# Patient Record
Sex: Female | Born: 1960 | Hispanic: Yes | Marital: Single | Smoking: Never smoker
Health system: Southern US, Community
[De-identification: ages and names within clinical notes are randomized; demographics above are authoritative.]

## PROBLEM LIST (undated history)

## (undated) DIAGNOSIS — E119 Type 2 diabetes mellitus without complications: Secondary | ICD-10-CM

## (undated) DIAGNOSIS — F419 Anxiety disorder, unspecified: Secondary | ICD-10-CM

## (undated) DIAGNOSIS — G4730 Sleep apnea, unspecified: Secondary | ICD-10-CM

## (undated) DIAGNOSIS — Z6841 Body Mass Index (BMI) 40.0 and over, adult: Secondary | ICD-10-CM

## (undated) DIAGNOSIS — I10 Essential (primary) hypertension: Secondary | ICD-10-CM

## (undated) DIAGNOSIS — F5104 Psychophysiologic insomnia: Secondary | ICD-10-CM

## (undated) DIAGNOSIS — N200 Calculus of kidney: Secondary | ICD-10-CM

## (undated) DIAGNOSIS — K219 Gastro-esophageal reflux disease without esophagitis: Secondary | ICD-10-CM

## (undated) DIAGNOSIS — E1142 Type 2 diabetes mellitus with diabetic polyneuropathy: Secondary | ICD-10-CM

## (undated) DIAGNOSIS — E039 Hypothyroidism, unspecified: Secondary | ICD-10-CM

## (undated) DIAGNOSIS — F32A Depression, unspecified: Secondary | ICD-10-CM

## (undated) DIAGNOSIS — J45909 Unspecified asthma, uncomplicated: Secondary | ICD-10-CM

## (undated) HISTORY — PX: ABDOMINAL HYSTERECTOMY: SHX81

---

## 2017-12-20 ENCOUNTER — Encounter: Admission: RE | Payer: Self-pay | Source: Ambulatory Visit

## 2017-12-20 ENCOUNTER — Ambulatory Visit: Admission: RE | Admit: 2017-12-20 | Payer: Medicare HMO | Source: Ambulatory Visit | Admitting: Internal Medicine

## 2017-12-20 SURGERY — ESOPHAGOGASTRODUODENOSCOPY (EGD) WITH PROPOFOL
Anesthesia: General

## 2019-09-29 ENCOUNTER — Ambulatory Visit: Payer: Medicare HMO

## 2020-04-23 ENCOUNTER — Other Ambulatory Visit: Payer: Self-pay

## 2020-04-23 ENCOUNTER — Other Ambulatory Visit
Admission: RE | Admit: 2020-04-23 | Discharge: 2020-04-23 | Disposition: A | Discharge status: Home / Self Care | Payer: Medicare HMO | Source: Ambulatory Visit | Attending: Gastroenterology | Admitting: Gastroenterology

## 2020-04-23 DIAGNOSIS — Z20822 Contact with and (suspected) exposure to covid-19: Secondary | ICD-10-CM | POA: Insufficient documentation

## 2020-04-23 DIAGNOSIS — Z01812 Encounter for preprocedural laboratory examination: Secondary | ICD-10-CM | POA: Diagnosis present

## 2020-04-24 ENCOUNTER — Encounter: Payer: Self-pay | Admitting: *Deleted

## 2020-04-24 LAB — SARS CORONAVIRUS 2 (TAT 6-24 HRS): SARS Coronavirus 2: NEGATIVE

## 2020-04-25 ENCOUNTER — Encounter
Admission: RE | Disposition: A | Discharge status: Home / Self Care | Payer: Self-pay | Source: Home / Self Care | Attending: Gastroenterology

## 2020-04-25 ENCOUNTER — Encounter: Payer: Self-pay | Admitting: *Deleted

## 2020-04-25 ENCOUNTER — Ambulatory Visit
Admission: RE | Admit: 2020-04-25 | Discharge: 2020-04-25 | Disposition: A | Discharge status: Home / Self Care | Payer: Medicare HMO | Attending: Gastroenterology | Admitting: Gastroenterology

## 2020-04-25 ENCOUNTER — Ambulatory Visit: Payer: Medicare HMO | Admitting: Certified Registered"

## 2020-04-25 DIAGNOSIS — Z1211 Encounter for screening for malignant neoplasm of colon: Secondary | ICD-10-CM | POA: Diagnosis not present

## 2020-04-25 DIAGNOSIS — F32A Depression, unspecified: Secondary | ICD-10-CM | POA: Insufficient documentation

## 2020-04-25 DIAGNOSIS — Z888 Allergy status to other drugs, medicaments and biological substances status: Secondary | ICD-10-CM | POA: Insufficient documentation

## 2020-04-25 DIAGNOSIS — K635 Polyp of colon: Secondary | ICD-10-CM | POA: Insufficient documentation

## 2020-04-25 DIAGNOSIS — Z86010 Personal history of colonic polyps: Secondary | ICD-10-CM | POA: Diagnosis not present

## 2020-04-25 DIAGNOSIS — Z90710 Acquired absence of both cervix and uterus: Secondary | ICD-10-CM | POA: Insufficient documentation

## 2020-04-25 DIAGNOSIS — Z79899 Other long term (current) drug therapy: Secondary | ICD-10-CM | POA: Diagnosis not present

## 2020-04-25 DIAGNOSIS — Z7951 Long term (current) use of inhaled steroids: Secondary | ICD-10-CM | POA: Diagnosis not present

## 2020-04-25 DIAGNOSIS — Z800 Family history of malignant neoplasm of digestive organs: Secondary | ICD-10-CM | POA: Diagnosis not present

## 2020-04-25 DIAGNOSIS — I10 Essential (primary) hypertension: Secondary | ICD-10-CM | POA: Insufficient documentation

## 2020-04-25 DIAGNOSIS — Z87442 Personal history of urinary calculi: Secondary | ICD-10-CM | POA: Insufficient documentation

## 2020-04-25 DIAGNOSIS — E1142 Type 2 diabetes mellitus with diabetic polyneuropathy: Secondary | ICD-10-CM | POA: Diagnosis not present

## 2020-04-25 DIAGNOSIS — E039 Hypothyroidism, unspecified: Secondary | ICD-10-CM | POA: Insufficient documentation

## 2020-04-25 DIAGNOSIS — K219 Gastro-esophageal reflux disease without esophagitis: Secondary | ICD-10-CM | POA: Diagnosis not present

## 2020-04-25 DIAGNOSIS — Z7984 Long term (current) use of oral hypoglycemic drugs: Secondary | ICD-10-CM | POA: Diagnosis not present

## 2020-04-25 DIAGNOSIS — K5730 Diverticulosis of large intestine without perforation or abscess without bleeding: Secondary | ICD-10-CM | POA: Diagnosis not present

## 2020-04-25 DIAGNOSIS — J4520 Mild intermittent asthma, uncomplicated: Secondary | ICD-10-CM | POA: Insufficient documentation

## 2020-04-25 DIAGNOSIS — Z791 Long term (current) use of non-steroidal anti-inflammatories (NSAID): Secondary | ICD-10-CM | POA: Diagnosis not present

## 2020-04-25 DIAGNOSIS — J45909 Unspecified asthma, uncomplicated: Secondary | ICD-10-CM | POA: Insufficient documentation

## 2020-04-25 DIAGNOSIS — K640 First degree hemorrhoids: Secondary | ICD-10-CM | POA: Insufficient documentation

## 2020-04-25 DIAGNOSIS — F419 Anxiety disorder, unspecified: Secondary | ICD-10-CM | POA: Diagnosis not present

## 2020-04-25 DIAGNOSIS — E119 Type 2 diabetes mellitus without complications: Secondary | ICD-10-CM | POA: Insufficient documentation

## 2020-04-25 DIAGNOSIS — G4730 Sleep apnea, unspecified: Secondary | ICD-10-CM | POA: Insufficient documentation

## 2020-04-25 HISTORY — DX: Unspecified asthma, uncomplicated: J45.909

## 2020-04-25 HISTORY — DX: Body Mass Index (BMI) 40.0 and over, adult: Z684

## 2020-04-25 HISTORY — DX: Sleep apnea, unspecified: G47.30

## 2020-04-25 HISTORY — DX: Anxiety disorder, unspecified: F41.9

## 2020-04-25 HISTORY — DX: Type 2 diabetes mellitus without complications: E11.9

## 2020-04-25 HISTORY — PX: COLONOSCOPY WITH PROPOFOL: SHX5780

## 2020-04-25 HISTORY — DX: Essential (primary) hypertension: I10

## 2020-04-25 HISTORY — DX: Psychophysiologic insomnia: F51.04

## 2020-04-25 HISTORY — DX: Gastro-esophageal reflux disease without esophagitis: K21.9

## 2020-04-25 HISTORY — DX: Depression, unspecified: F32.A

## 2020-04-25 HISTORY — DX: Hypothyroidism, unspecified: E03.9

## 2020-04-25 HISTORY — DX: Type 2 diabetes mellitus with diabetic polyneuropathy: E11.42

## 2020-04-25 HISTORY — DX: Calculus of kidney: N20.0

## 2020-04-25 LAB — GLUCOSE, CAPILLARY: Glucose-Capillary: 94 mg/dL (ref 70–99)

## 2020-04-25 SURGERY — COLONOSCOPY WITH PROPOFOL
Anesthesia: General

## 2020-04-25 MED ORDER — SODIUM CHLORIDE 0.9 % IV SOLN
INTRAVENOUS | Status: DC
  Administered 2020-04-25: 1000 mL via INTRAVENOUS

## 2020-04-25 MED ORDER — LIDOCAINE HCL (CARDIAC) PF 100 MG/5ML IV SOSY
PREFILLED_SYRINGE | INTRAVENOUS | Status: DC | PRN
  Administered 2020-04-25: 100 mg via INTRAVENOUS

## 2020-04-25 MED ORDER — PROPOFOL 500 MG/50ML IV EMUL
INTRAVENOUS | Status: DC | PRN
  Administered 2020-04-25: 165 ug/kg/min via INTRAVENOUS

## 2020-04-25 MED ORDER — PROPOFOL 10 MG/ML IV BOLUS
INTRAVENOUS | Status: DC | PRN
  Administered 2020-04-25: 50 mg via INTRAVENOUS

## 2020-04-25 NOTE — H&P (Signed)
Outpatient short stay form Pre-procedure 04/25/2020 9:51 AM Rachel Lot MD, MPH  Primary Physician: Dr. Greggory Stallion  Reason for visit:  Surveillance  History of present illness:   59 y/o lady with history of colon cancer in her Mother at the age of 53 and polyp that was "normal" on colonoscopy 3 years ago. Has history of hysterectomy. No blood thinners. No new symptoms.   No current facility-administered medications for this encounter.  Medications Prior to Admission  Medication Sig Dispense Refill Last Dose  . atorvastatin (LIPITOR) 40 MG tablet Take 40 mg by mouth daily.     . busPIRone (BUSPAR) 5 MG tablet Take 5 mg by mouth 3 (three) times daily.     . Cholecalciferol 125 MCG (5000 UT) capsule Take 5,000 Units by mouth daily.     . citalopram (CELEXA) 20 MG tablet Take 20 mg by mouth daily.     . diclofenac Sodium (VOLTAREN) 1 % GEL Apply topically 4 (four) times daily.     Marland Kitchen doxazosin (CARDURA) 1 MG tablet Take 1 mg by mouth daily.     . famotidine (PEPCID) 20 MG tablet Take 20 mg by mouth 2 (two) times daily.     . Fluticasone-Salmeterol (ADVAIR) 250-50 MCG/DOSE AEPB Inhale 1 puff into the lungs 2 (two) times daily.     . folic acid (FOLVITE) 400 MCG tablet Take 400 mcg by mouth daily.     Marland Kitchen gabapentin (NEURONTIN) 600 MG tablet Take 600 mg by mouth 3 (three) times daily.     . hydrALAZINE (APRESOLINE) 50 MG tablet Take 50 mg by mouth 3 (three) times daily.     . hydrochlorothiazide (HYDRODIURIL) 25 MG tablet Take 25 mg by mouth daily.     Marland Kitchen ipratropium (ATROVENT HFA) 17 MCG/ACT inhaler Inhale 2 puffs into the lungs every 6 (six) hours.     Marland Kitchen levocetirizine (XYZAL) 5 MG tablet Take 5 mg by mouth every evening.     Marland Kitchen levothyroxine (SYNTHROID) 25 MCG tablet Take 25 mcg by mouth daily before breakfast.     . losartan (COZAAR) 100 MG tablet Take 100 mg by mouth daily.     . Melatonin 5 MG CHEW Chew by mouth.     . montelukast (SINGULAIR) 10 MG tablet Take 10 mg by mouth at bedtime.      Marland Kitchen nystatin cream (MYCOSTATIN) Apply 1 application topically 2 (two) times daily.     . pantoprazole (PROTONIX) 40 MG tablet Take 40 mg by mouth daily.     . sitaGLIPtin (JANUVIA) 100 MG tablet Take 100 mg by mouth daily.     Marland Kitchen spironolactone (ALDACTONE) 25 MG tablet Take 25 mg by mouth daily.     Marland Kitchen tiZANidine (ZANAFLEX) 2 MG tablet Take by mouth every 6 (six) hours as needed for muscle spasms.        Allergies  Allergen Reactions  . Trazodone And Nefazodone      Past Medical History:  Diagnosis Date  . Anxiety   . Asthma   . BMI 40.0-44.9, adult (HCC)   . Chronic insomnia   . Depression   . Diabetes mellitus without complication (HCC)   . Diabetic peripheral neuropathy (HCC)   . GERD (gastroesophageal reflux disease)   . Hypertension   . Hypothyroidism   . Nephrolithiasis   . Sleep apnea     Review of systems:  Otherwise negative.    Physical Exam  Gen: Alert, oriented. Appears stated age.  HEENT: [place]/AT. PERRLA. Lungs: No  respiratory distress CV: RRR Abd: soft, benign, no masses. Ext: No edema.    Planned procedures: Proceed with colonoscopy. The patient understands the nature of the planned procedure, indications, risks, alternatives and potential complications including but not limited to bleeding, infection, perforation, damage to internal organs and possible oversedation/side effects from anesthesia. The patient agrees and gives consent to proceed.  Please refer to procedure notes for findings, recommendations and patient disposition/instructions.     Rachel Lot MD, MPH Gastroenterology 04/25/2020  9:51 AM

## 2020-04-25 NOTE — Transfer of Care (Signed)
Immediate Anesthesia Transfer of Care Note  Patient: Rachel Rowe  Procedure(s) Performed: COLONOSCOPY WITH PROPOFOL (N/A )  Patient Location: Endoscopy Unit  Anesthesia Type:General  Level of Consciousness: drowsy, patient cooperative and responds to stimulation  Airway & Oxygen Therapy: Patient Spontanous Breathing and Patient connected to face mask oxygen  Post-op Assessment: Report given to RN and Post -op Vital signs reviewed and stable  Post vital signs: Reviewed and stable  Last Vitals:  Vitals Value Taken Time  BP 104/59 04/25/20 1051  Temp 35.6 C 04/25/20 1051  Pulse 74 04/25/20 1055  Resp 23 04/25/20 1055  SpO2 100 % 04/25/20 1055  Vitals shown include unvalidated device data.  Last Pain:  Vitals:   04/25/20 1051  TempSrc: Temporal  PainSc: Asleep         Complications: No complications documented.

## 2020-04-25 NOTE — Interval H&P Note (Signed)
History and Physical Interval Note:  04/25/2020 9:53 AM  Rachel Rowe  has presented today for surgery, with the diagnosis of PH POLYPS.  The various methods of treatment have been discussed with the patient and family. After consideration of risks, benefits and other options for treatment, the patient has consented to  Procedure(s) with comments: COLONOSCOPY WITH PROPOFOL (N/A) - NEED SPANISH INTERPRETER as a surgical intervention.  The patient's history has been reviewed, patient examined, no change in status, stable for surgery.  I have reviewed the patient's chart and labs.  Questions were answered to the patient's satisfaction.     Regis Bill  Ok to proceed with colonoscopy.

## 2020-04-25 NOTE — Op Note (Signed)
[facility] Gastroenterology Patient Name: Rachel Rowe Procedure Date: 04/25/2020 10:26 AM MRN: [id number] Account #: [id number] Date of Birth: 04-04-1961 Admit Type: Outpatient Age: 59 Room: [facility] ENDO ROOM 3 Gender: Female Note Status: Finalized Procedure:             Colonoscopy Indications:           High risk colon cancer surveillance: Personal history                         of colonic polyps, Family history of colon cancer Providers:             Andrey Farmer MD, MD Referring MD:          No Local Md, MD (Referring MD) Medicines:             Monitored Anesthesia Care Complications:         No immediate complications. Estimated blood loss:                         Minimal. Procedure:             Pre-Anesthesia Assessment:                        - Prior to the procedure, a History and Physical was                         performed, and patient medications and allergies were                         reviewed. The patient is competent. The risks and                         benefits of the procedure and the sedation options and                         risks were discussed with the patient. All questions                         were answered and informed consent was obtained.                         Patient identification and proposed procedure were                         verified by the physician, the nurse, the anesthetist                         and the technician in the endoscopy suite. Mental                         Status Examination: alert and oriented. Airway                         Examination: normal oropharyngeal airway and neck                         mobility. Respiratory Examination: clear to  auscultation. CV Examination: normal. Prophylactic                         Antibiotics: The patient does not require prophylactic                         antibiotics. Prior Anticoagulants: The patient has                          taken no previous anticoagulant or antiplatelet                         agents. ASA Grade Assessment: II - A patient with mild                         systemic disease. After reviewing the risks and                         benefits, the patient was deemed in satisfactory                         condition to undergo the procedure. The anesthesia                         plan was to use monitored anesthesia care (MAC).                         Immediately prior to administration of medications,                         the patient was re-assessed for adequacy to receive                         sedatives. The heart rate, respiratory rate, oxygen                         saturations, blood pressure, adequacy of pulmonary                         ventilation, and response to care were monitored                         throughout the procedure. The physical status of the                         patient was re-assessed after the procedure.                        After obtaining informed consent, the colonoscope was                         passed under direct vision. Throughout the procedure,                         the patient's blood pressure, pulse, and oxygen                         saturations were monitored continuously. The  Colonoscope was introduced through the anus and                         advanced to the the cecum, identified by appendiceal                         orifice and ileocecal valve. The colonoscopy was                         performed without difficulty. The patient tolerated                         the procedure well. The quality of the bowel                         preparation was good. Findings:      The perianal and digital rectal examinations were normal.      A single small-mouthed diverticulum was found in the descending colon.      A 2 mm polyp was found in the sigmoid colon. The polyp was hyperplastic.       The polyp was removed with a jumbo  cold forceps. Resection and retrieval       were complete. Estimated blood loss was minimal.      A localized area of mildly erythematous mucosa was found in the       descending colon. Biopsies were taken with a cold forceps for histology.       Estimated blood loss was minimal.      Non-bleeding internal hemorrhoids were found during retroflexion. The       hemorrhoids were Grade I (internal hemorrhoids that do not prolapse).      The exam was otherwise without abnormality on direct and retroflexion       views. Impression:            - Diverticulosis in the descending colon.                        - One 2 mm polyp in the sigmoid colon, removed with a                         jumbo cold forceps. Resected and retrieved.                        - Erythematous mucosa in the descending colon.                         Biopsied.                        - Non-bleeding internal hemorrhoids.                        - The examination was otherwise normal on direct and                         retroflexion views. Recommendation:        - Discharge patient to home.                        - Resume previous diet.                        -  Continue present medications.                        - Await pathology results.                        - Repeat colonoscopy in 5 years for surveillance.                        - Return to referring physician as previously                         scheduled. Procedure Code(s):     --- Professional ---                        [phone], Colonoscopy, flexible; with biopsy, single or                         multiple Diagnosis Code(s):     --- Professional ---                        Z86.010, Personal history of colonic polyps                        K64.0, First degree hemorrhoids                        K63.5, Polyp of colon                        K63.89, Other specified diseases of intestine                        Z80.0, Family history of malignant neoplasm of                          digestive organs                        K57.30, Diverticulosis of large intestine without                         perforation or abscess without bleeding CPT copyright 2019 American Medical Association. All rights reserved. The codes documented in this report are preliminary and upon coder review may  be revised to meet current compliance requirements. Andrey Farmer, MD Andrey Farmer MD, MD 04/25/2020 10:51:07 AM Number of Addenda: 0 Note Initiated On: 04/25/2020 10:26 AM Scope Withdrawal Time: 0 hours 9 minutes 55 seconds  Total Procedure Duration: 0 hours 15 minutes 32 seconds  Estimated Blood Loss:  Estimated blood loss was minimal.      [facility]

## 2020-04-25 NOTE — Anesthesia Preprocedure Evaluation (Signed)
Anesthesia Evaluation  Patient identified by MRN, date of birth, ID band Patient awake    Reviewed: Allergy & Precautions, NPO status , Patient's Chart, lab work & pertinent test results  History of Anesthesia Complications Negative for: history of anesthetic complications  Airway Mallampati: II  TM Distance: >3 FB Neck ROM: Full    Dental no notable dental hx.    Pulmonary asthma (mild intermittent) , sleep apnea ,    breath sounds clear to auscultation- rhonchi (-) wheezing      Cardiovascular hypertension, Pt. on medications (-) CAD, (-) Past MI, (-) Cardiac Stents and (-) CABG  Rhythm:Regular Rate:Normal - Systolic murmurs and - Diastolic murmurs    Neuro/Psych neg Seizures PSYCHIATRIC DISORDERS Anxiety Depression negative neurological ROS     GI/Hepatic Neg liver ROS, GERD  ,  Endo/Other  diabetes, Oral Hypoglycemic AgentsHypothyroidism   Renal/GU Renal disease: hx of nephrolithiasis.     Musculoskeletal negative musculoskeletal ROS (+)   Abdominal (+) + obese,   Peds  Hematology negative hematology ROS (+)   Anesthesia Other Findings Past Medical History: No date: Anxiety No date: Asthma No date: BMI 40.0-44.9, adult (HCC) No date: Chronic insomnia No date: Depression No date: Diabetes mellitus without complication (HCC) No date: Diabetic peripheral neuropathy (HCC) No date: GERD (gastroesophageal reflux disease) No date: Hypertension No date: Hypothyroidism No date: Nephrolithiasis No date: Sleep apnea   Reproductive/Obstetrics                             Anesthesia Physical Anesthesia Plan  ASA: III  Anesthesia Plan: General   Post-op Pain Management:    Induction: Intravenous  PONV Risk Score and Plan: 2 and Propofol infusion  Airway Management Planned: Natural Airway  Additional Equipment:   Intra-op Plan:   Post-operative Plan:   Informed Consent: I  have reviewed the patients History and Physical, chart, labs and discussed the procedure including the risks, benefits and alternatives for the proposed anesthesia with the patient or authorized representative who has indicated his/her understanding and acceptance.     Dental advisory given  Plan Discussed with: CRNA and Anesthesiologist  Anesthesia Plan Comments:         Anesthesia Quick Evaluation

## 2020-04-25 NOTE — Anesthesia Postprocedure Evaluation (Signed)
Anesthesia Post Note  Patient: Rachel Rowe  Procedure(s) Performed: COLONOSCOPY WITH PROPOFOL (N/A )  Patient location during evaluation: Endoscopy Anesthesia Type: General Level of consciousness: awake and alert and oriented Pain management: pain level controlled Vital Signs Assessment: post-procedure vital signs reviewed and stable Respiratory status: spontaneous breathing, nonlabored ventilation and respiratory function stable Cardiovascular status: blood pressure returned to baseline and stable Postop Assessment: no signs of nausea or vomiting Anesthetic complications: no   No complications documented.   Last Vitals:  Vitals:   04/25/20 1120 04/25/20 1130  BP: (!) 129/92 127/77  Pulse: 68 69  Resp: 13 17  Temp:    SpO2: 98% 99%    Last Pain:  Vitals:   04/25/20 1051  TempSrc: Temporal  PainSc: Asleep                 Nazim Kadlec

## 2020-04-25 NOTE — Anesthesia Procedure Notes (Signed)
Procedure Name: General with mask airway Performed by: Fletcher-Harrison, Senya Hinzman, CRNA Pre-anesthesia Checklist: Patient identified, Emergency Drugs available, Suction available and Patient being monitored Patient Re-evaluated:Patient Re-evaluated prior to induction Oxygen Delivery Method: Simple face mask Induction Type: IV induction Placement Confirmation: positive ETCO2 and CO2 detector Dental Injury: Teeth and Oropharynx as per pre-operative assessment        

## 2020-04-28 LAB — SURGICAL PATHOLOGY

## 2020-04-30 ENCOUNTER — Other Ambulatory Visit: Payer: Self-pay | Admitting: Family Medicine

## 2020-04-30 ENCOUNTER — Ambulatory Visit
Admission: RE | Admit: 2020-04-30 | Discharge: 2020-04-30 | Disposition: A | Discharge status: Home / Self Care | Payer: Medicare HMO | Source: Ambulatory Visit | Attending: Family Medicine | Admitting: Family Medicine

## 2020-04-30 ENCOUNTER — Other Ambulatory Visit: Payer: Self-pay

## 2020-04-30 DIAGNOSIS — R1032 Left lower quadrant pain: Secondary | ICD-10-CM | POA: Insufficient documentation

## 2020-04-30 LAB — POCT I-STAT CREATININE: Creatinine, Ser: 1.1 mg/dL — ABNORMAL HIGH (ref 0.44–1.00)

## 2020-04-30 MED ORDER — IOHEXOL 300 MG/ML  SOLN
100.0000 mL | Freq: Once | INTRAMUSCULAR | Status: AC | PRN
  Administered 2020-04-30: 100 mL via INTRAVENOUS

## 2021-03-19 ENCOUNTER — Other Ambulatory Visit: Payer: Self-pay | Admitting: Family Medicine

## 2021-03-19 DIAGNOSIS — Z1231 Encounter for screening mammogram for malignant neoplasm of breast: Secondary | ICD-10-CM

## 2021-04-20 ENCOUNTER — Other Ambulatory Visit: Payer: Self-pay

## 2021-04-20 ENCOUNTER — Ambulatory Visit
Admission: RE | Admit: 2021-04-20 | Discharge: 2021-04-20 | Disposition: A | Discharge status: Home / Self Care | Payer: Medicare HMO | Source: Ambulatory Visit | Attending: Family Medicine | Admitting: Family Medicine

## 2021-04-20 DIAGNOSIS — Z1231 Encounter for screening mammogram for malignant neoplasm of breast: Secondary | ICD-10-CM | POA: Diagnosis not present

## 2021-04-21 ENCOUNTER — Inpatient Hospital Stay
Admission: RE | Admit: 2021-04-21 | Discharge: 2021-04-21 | Disposition: A | Discharge status: Home / Self Care | Payer: Self-pay | Source: Ambulatory Visit | Attending: *Deleted | Admitting: *Deleted

## 2021-04-21 ENCOUNTER — Other Ambulatory Visit: Payer: Self-pay | Admitting: *Deleted

## 2021-04-21 DIAGNOSIS — Z1231 Encounter for screening mammogram for malignant neoplasm of breast: Secondary | ICD-10-CM

## 2021-08-17 ENCOUNTER — Other Ambulatory Visit (HOSPITAL_COMMUNITY): Payer: Self-pay | Admitting: Family Medicine

## 2021-08-17 ENCOUNTER — Other Ambulatory Visit: Payer: Self-pay | Admitting: Family Medicine

## 2021-08-17 DIAGNOSIS — R109 Unspecified abdominal pain: Secondary | ICD-10-CM

## 2021-08-18 ENCOUNTER — Other Ambulatory Visit: Payer: Self-pay

## 2021-08-18 ENCOUNTER — Ambulatory Visit
Admission: RE | Admit: 2021-08-18 | Discharge: 2021-08-18 | Disposition: A | Discharge status: Home / Self Care | Payer: Medicare HMO | Source: Ambulatory Visit | Attending: Family Medicine | Admitting: Family Medicine

## 2021-08-18 DIAGNOSIS — R109 Unspecified abdominal pain: Secondary | ICD-10-CM | POA: Insufficient documentation

## 2021-08-20 ENCOUNTER — Other Ambulatory Visit: Payer: Self-pay

## 2021-08-20 ENCOUNTER — Emergency Department
Admission: EM | Admit: 2021-08-20 | Discharge: 2021-08-20 | Disposition: A | Discharge status: Home / Self Care | Payer: Medicare HMO | Attending: Emergency Medicine | Admitting: Emergency Medicine

## 2021-08-20 ENCOUNTER — Encounter: Payer: Self-pay | Admitting: Emergency Medicine

## 2021-08-20 DIAGNOSIS — N200 Calculus of kidney: Secondary | ICD-10-CM | POA: Insufficient documentation

## 2021-08-20 DIAGNOSIS — J45909 Unspecified asthma, uncomplicated: Secondary | ICD-10-CM | POA: Diagnosis not present

## 2021-08-20 DIAGNOSIS — R109 Unspecified abdominal pain: Secondary | ICD-10-CM | POA: Diagnosis present

## 2021-08-20 DIAGNOSIS — E119 Type 2 diabetes mellitus without complications: Secondary | ICD-10-CM | POA: Insufficient documentation

## 2021-08-20 DIAGNOSIS — E039 Hypothyroidism, unspecified: Secondary | ICD-10-CM | POA: Diagnosis not present

## 2021-08-20 DIAGNOSIS — I10 Essential (primary) hypertension: Secondary | ICD-10-CM | POA: Diagnosis not present

## 2021-08-20 LAB — BASIC METABOLIC PANEL
Anion gap: 8 (ref 5–15)
BUN: 12 mg/dL (ref 8–23)
CO2: 29 mmol/L (ref 22–32)
Calcium: 9.3 mg/dL (ref 8.9–10.3)
Chloride: 103 mmol/L (ref 98–111)
Creatinine, Ser: 0.66 mg/dL (ref 0.44–1.00)
GFR, Estimated: >60 mL/min (ref >60)
Glucose, Bld: 90 mg/dL (ref 70–99)
Potassium: 4.5 mmol/L (ref 3.5–5.1)
Sodium: 140 mmol/L (ref 135–145)

## 2021-08-20 LAB — URINALYSIS, ROUTINE W REFLEX MICROSCOPIC
Bacteria, UA: NONE SEEN
Bilirubin Urine: NEGATIVE
Glucose, UA: NEGATIVE mg/dL
Ketones, ur: NEGATIVE mg/dL
Leukocytes,Ua: NEGATIVE
Nitrite: NEGATIVE
Protein, ur: NEGATIVE mg/dL
Specific Gravity, Urine: 1.008 (ref 1.005–1.030)
Squamous Epithelial / HPF: NONE SEEN (ref 0–5)
WBC, UA: NONE SEEN WBC/hpf (ref 0–5)
pH: 8 (ref 5.0–8.0)

## 2021-08-20 LAB — CBC
HCT: 37.5 % (ref 36.0–46.0)
Hemoglobin: 11.7 g/dL — ABNORMAL LOW (ref 12.0–15.0)
MCH: 29.7 pg (ref 26.0–34.0)
MCHC: 31.2 g/dL (ref 30.0–36.0)
MCV: 95.2 fL (ref 80.0–100.0)
Platelets: 306 10*3/uL (ref 150–400)
RBC: 3.94 MIL/uL (ref 3.87–5.11)
RDW: 13.2 % (ref 11.5–15.5)
WBC: 9.8 10*3/uL (ref 4.0–10.5)
nRBC: 0 % (ref 0.0–0.2)

## 2021-08-20 MED ORDER — KETOROLAC TROMETHAMINE 30 MG/ML IJ SOLN
15.0000 mg | Freq: Once | INTRAMUSCULAR | Status: AC
  Administered 2021-08-20: 15 mg via INTRAVENOUS
  Filled 2021-08-20: qty 1

## 2021-08-20 MED ORDER — TAMSULOSIN HCL 0.4 MG PO CAPS: 0.4000 mg | ORAL_CAPSULE | Freq: Every day | ORAL | 0 refills | Status: AC

## 2021-08-20 MED ORDER — SODIUM CHLORIDE 0.9 % IV BOLUS
1000.0000 mL | Freq: Once | INTRAVENOUS | Status: AC
  Administered 2021-08-20: 1000 mL via INTRAVENOUS

## 2021-08-20 MED ORDER — HYDROCODONE-ACETAMINOPHEN 5-325 MG PO TABS: 1.0000 | ORAL_TABLET | Freq: Four times a day (QID) | ORAL | 0 refills | Status: AC | PRN

## 2021-08-20 MED ORDER — ONDANSETRON 4 MG PO TBDP: 4.0000 mg | ORAL_TABLET | Freq: Three times a day (TID) | ORAL | 0 refills | Status: AC | PRN

## 2021-08-20 NOTE — ED Triage Notes (Signed)
Pt comes into the ED via POV c/o right flank pain that started last week and has progressively gotten worse.  Pt has a h/o kidney stones.  Pt saw MD last week and they ordered outpatient CT, but she has not gotten the results.  Pt c/o severe pain with urination as well as burning.  Pt ambulatory to triage at this time with even and unlabored respirations.

## 2021-08-20 NOTE — Discharge Instructions (Addendum)
-  Take tamsulosin daily as prescribed. Take ondansetron as needed for nausea. -Manage pain at home with ibuprofen as needed.  Only take hydrocodone/acetaminophen only if pain worsens

## 2021-08-20 NOTE — ED Provider Notes (Signed)
[facility] Provider Note    Event Date/Time   First MD Initiated Contact with Patient 08/20/21 1522     (approximate)   History   Chief Complaint Flank Pain   HPI Rachel Rowe is a 61 y.o. female, history of hypertension, asthma, hypothyroidism, diabetes, anxiety, nephrolithiasis, presents to the emergency department for evaluation of flank pain.  Patient states that the flank pain started last week and has progressively gotten worse.  Predominantly right-sided pain.  Reports burning sensation when she urinates.  She states that she received a, outpatient CT scan by her primary care provider for suspected kidney stones, but she was not informed of the results or provided with any treatment.  Denies chest pain, shortness of breath, abdominal pain, fever/chills, rashes/lesions, headache, or nausea/vomiting.  Per external records review, patient was recently evaluated on 08/14/2021 at [facility] primary care where she presented with right-sided flank pain.  An outpatient renal stone CT was ordered.  She was provided with meloxicam for pain management  History Limitations: Spanish-speaking.      Physical Exam  Triage Vital Signs: ED Triage Vitals  Enc Vitals Group     BP 08/20/21 1354 (!) 145/72     Pulse Rate 08/20/21 1354 71     Resp 08/20/21 1354 17     Temp 08/20/21 1354 98.3 F (36.8 C)     Temp Source 08/20/21 1354 Oral     SpO2 08/20/21 1354 98 %     Weight 08/20/21 1352 223 lb 8.7 oz (101.4 kg)     Height 08/20/21 1352 5\' 4"  (1.626 m)     Head Circumference --      Peak Flow --      Pain Score 08/20/21 1352 10     Pain Loc --      Pain Edu? --      Excl. in GC? --     Most recent vital signs: Vitals:   08/20/21 1354  BP: (!) 145/72  Pulse: 71  Resp: 17  Temp: 98.3 F (36.8 C)  SpO2: 98%    General: Awake, NAD.  CV: Good peripheral perfusion.  Resp: Normal effort.  Abd: Soft, non-tender. No distention.  Neuro: At baseline.  No gross neurological deficits. Other: CVA tenderness present on the right side.  Physical Exam    ED Results / Procedures / Treatments  Labs (all labs ordered are listed, but only abnormal results are displayed) Labs Reviewed  URINALYSIS, ROUTINE W REFLEX MICROSCOPIC - Abnormal; Notable for the following components:      Result Value   Color, Urine STRAW (*)    APPearance CLEAR (*)    Hgb urine dipstick SMALL (*)    All other components within normal limits  CBC - Abnormal; Notable for the following components:   Hemoglobin 11.7 (*)    All other components within normal limits  BASIC METABOLIC PANEL     EKG Not applicable.   RADIOLOGY  ED Provider Interpretation: Not applicable.  No results found.  PROCEDURES:  Critical Care performed: None applicable.  Procedures    MEDICATIONS ORDERED IN ED: Medications  sodium chloride 0.9 % bolus 1,000 mL (0 mLs Intravenous Stopped 08/20/21 1657)  ketorolac (TORADOL) 30 MG/ML injection 15 mg (15 mg Intravenous Given 08/20/21 1706)     IMPRESSION / MDM / ASSESSMENT AND PLAN / ED COURSE  I reviewed the triage vital signs and the nursing notes.  Rachel Rowe is a 61 y.o. female, history of hypertension, asthma, hypothyroidism, diabetes, anxiety, nephrolithiasis, presents to the emergency department for evaluation of flank pain.  Patient states that the flank pain started last week and has progressively gotten worse.   Differential diagnosis includes, but is not limited to, nephrolithiasis, cystitis, pyelonephritis.   ED Course Patient appears stable, but uncomfortable.  Vital signs are within normal limits.  She is afebrile.  We will go ahead and treat with ketorolac IV and IV fluids.  CBC shows no leukocytosis or clinically significant anemia.  BMP unremarkable for electrolyte abnormalities or acute kidney injury.   Urinalysis significant for small amounts of hemoglobin in the urine,  otherwise no evidence of infection.  Consistent with nephrolithiasis  CT renal stone study from 08/18/2021 shows bilateral nonobstructive punctate nephrolithiasis.  No hydronephrosis.   Assessment/Plan History, physical exam, and work-up thus far consistent with nephrolithiasis.  Patient has not experienced an acute worsening since her CT scan 2 days prior.  She is afebrile.  Urinalysis unremarkable for evidence of infection.  Vital signs and lab work-up reassuring for no serious pathology.  Unlikely pyelonephritis, AAA, or other acute abdominal pathology. Patient states that her primary reason for being here was to receive the results of her CT scan and to receive outpatient medications.  We will plan to discharge this patient with a prescription for tamsulosin, ondansetron, and hydrocodone/acetaminophen to be used as needed for pain.  Encouraged patient to follow-up with her primary care provider as needed.  Patient was provided with anticipatory guidance, return precautions, and educational material. Encouraged the patient to return to the emergency department at any time if they begin to experience any new or worsening symptoms.     FINAL CLINICAL IMPRESSION(S) / ED DIAGNOSES   Final diagnoses:  Kidney stone     Rx / DC Orders   ED Discharge Orders          Ordered    tamsulosin (FLOMAX) 0.4 MG CAPS capsule  Daily        08/20/21 1708    HYDROcodone-acetaminophen (NORCO/VICODIN) 5-325 MG tablet  Every 6 hours PRN        08/20/21 1708    ondansetron (ZOFRAN-ODT) 4 MG disintegrating tablet  Every 8 hours PRN        08/20/21 1714             Note:  This document was prepared using Dragon voice recognition software and may include unintentional dictation errors.   Varney Daily, [place] 08/21/21 0041    Gilles Chiquito, MD 08/21/21 [phone]

## 2022-03-12 ENCOUNTER — Other Ambulatory Visit: Payer: Self-pay | Admitting: Family Medicine

## 2022-03-12 DIAGNOSIS — R1012 Left upper quadrant pain: Secondary | ICD-10-CM

## 2022-03-16 ENCOUNTER — Ambulatory Visit
Admission: RE | Admit: 2022-03-16 | Discharge: 2022-03-16 | Disposition: A | Discharge status: Home / Self Care | Payer: Medicare HMO | Source: Ambulatory Visit | Attending: Family Medicine | Admitting: Family Medicine

## 2022-03-16 DIAGNOSIS — R1012 Left upper quadrant pain: Secondary | ICD-10-CM | POA: Insufficient documentation

## 2023-04-15 IMAGING — MG MM DIGITAL SCREENING BILAT W/ TOMO AND CAD
6 of 10 series · 6 of 30 positions shown · non-contrast
Comparison: Previous exam(s).

CLINICAL DATA: Screening.

EXAM:
DIGITAL SCREENING BILATERAL MAMMOGRAM WITH TOMOSYNTHESIS AND CAD
TECHNIQUE: Bilateral screening digital craniocaudal and mediolateral oblique
mammograms were obtained. Bilateral screening digital breast
tomosynthesis was performed. The images were evaluated with
computer-aided detection.

[R CC synth-2D]
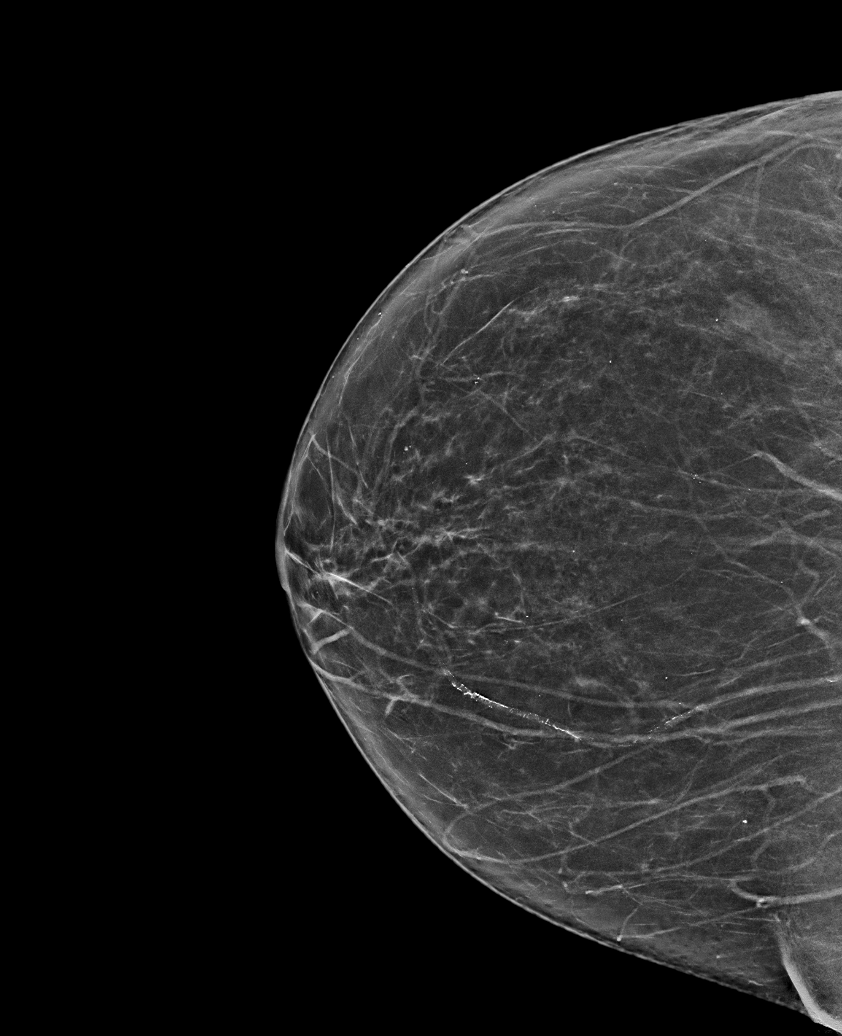

[L CC synth-2D]
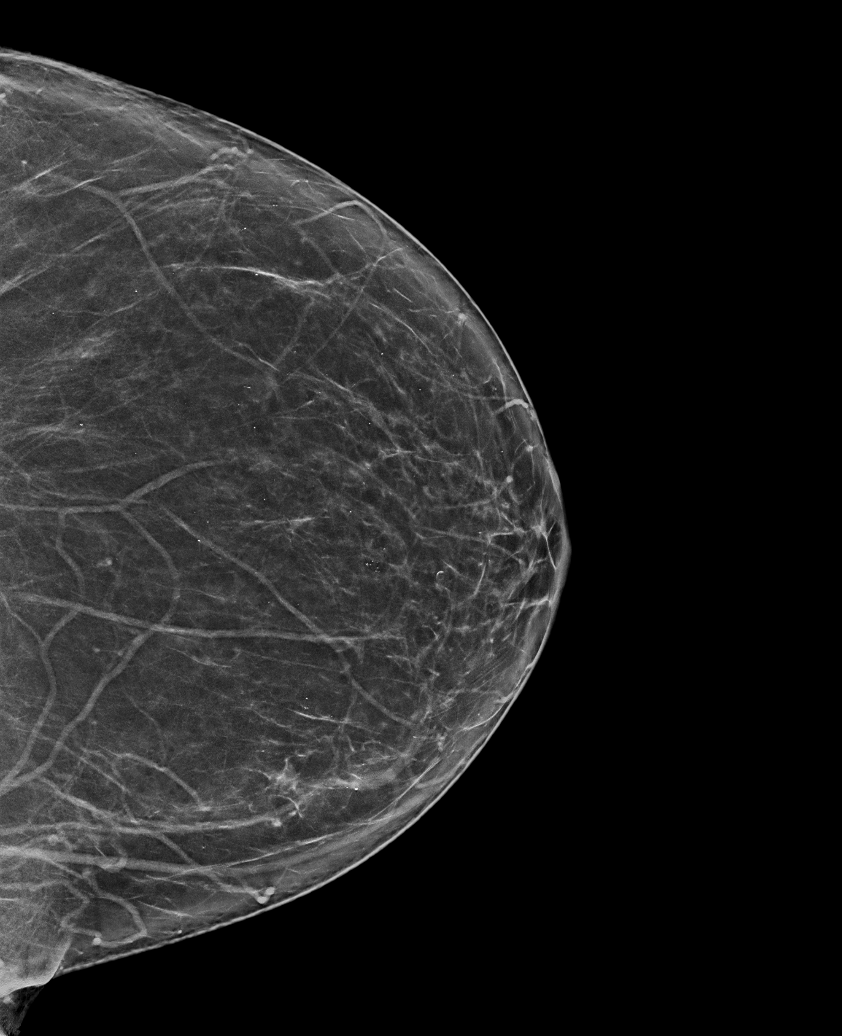

[R MLO synth-2D (1 of 2)]
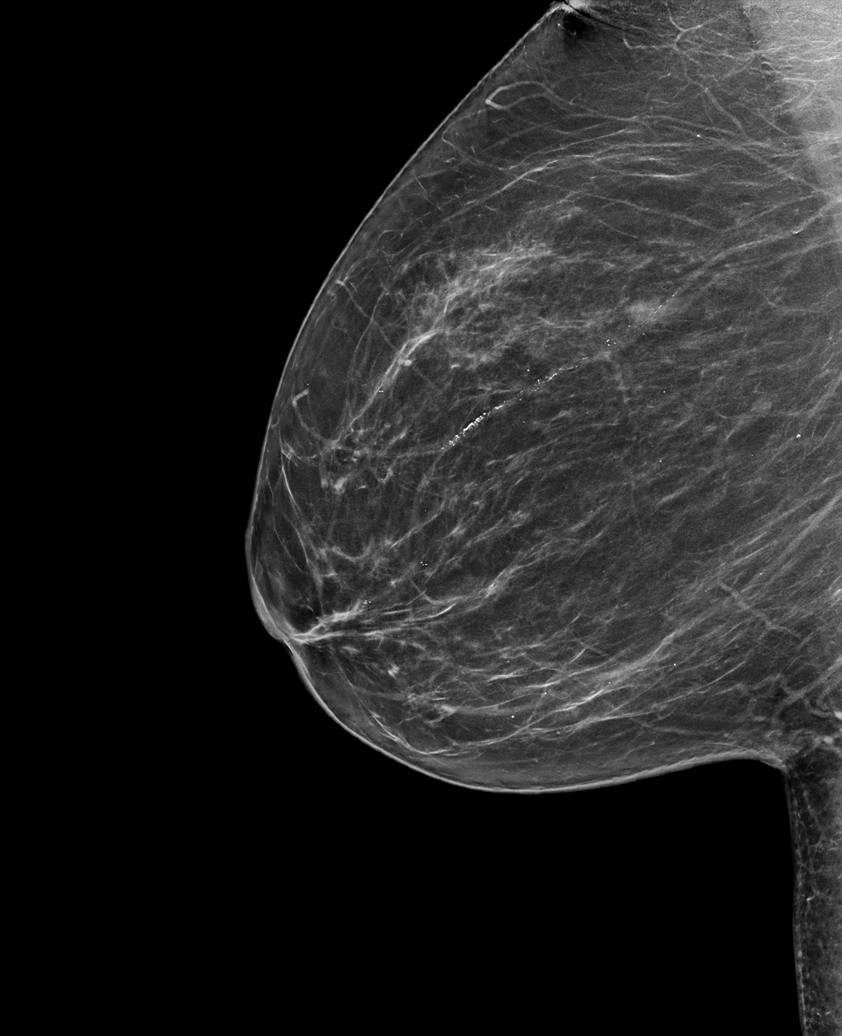

[R MLO synth-2D (2 of 2)]
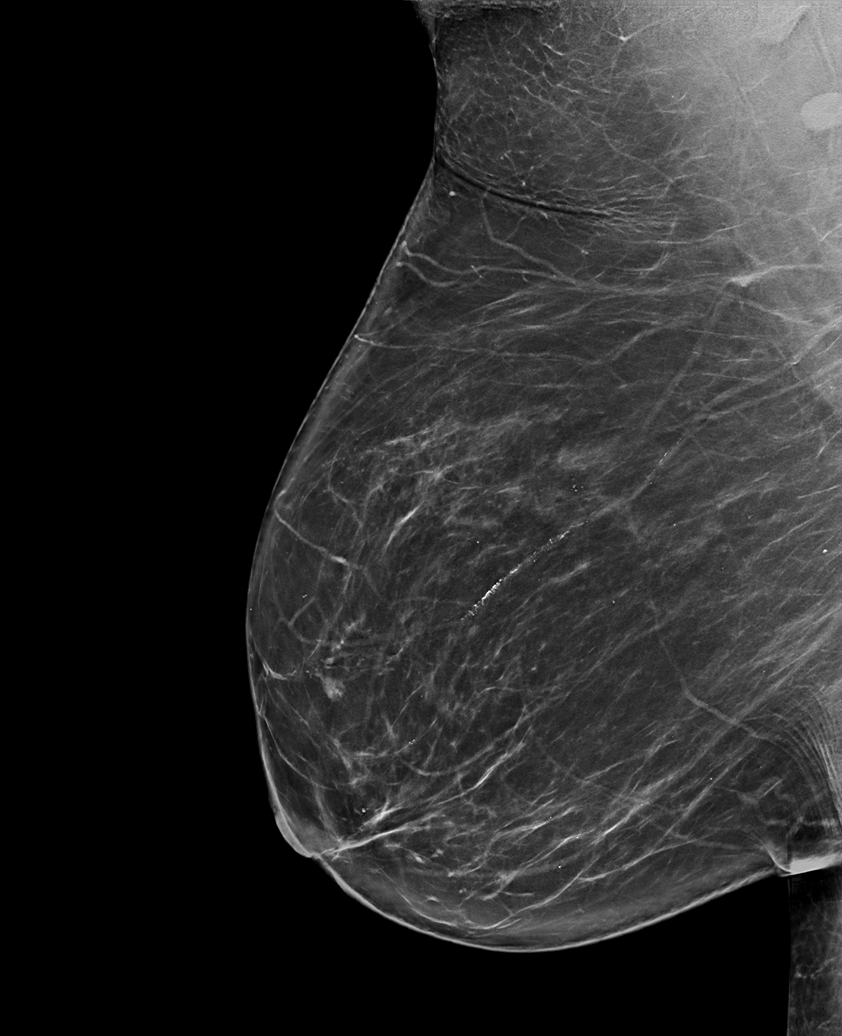

[L MLO synth-2D]
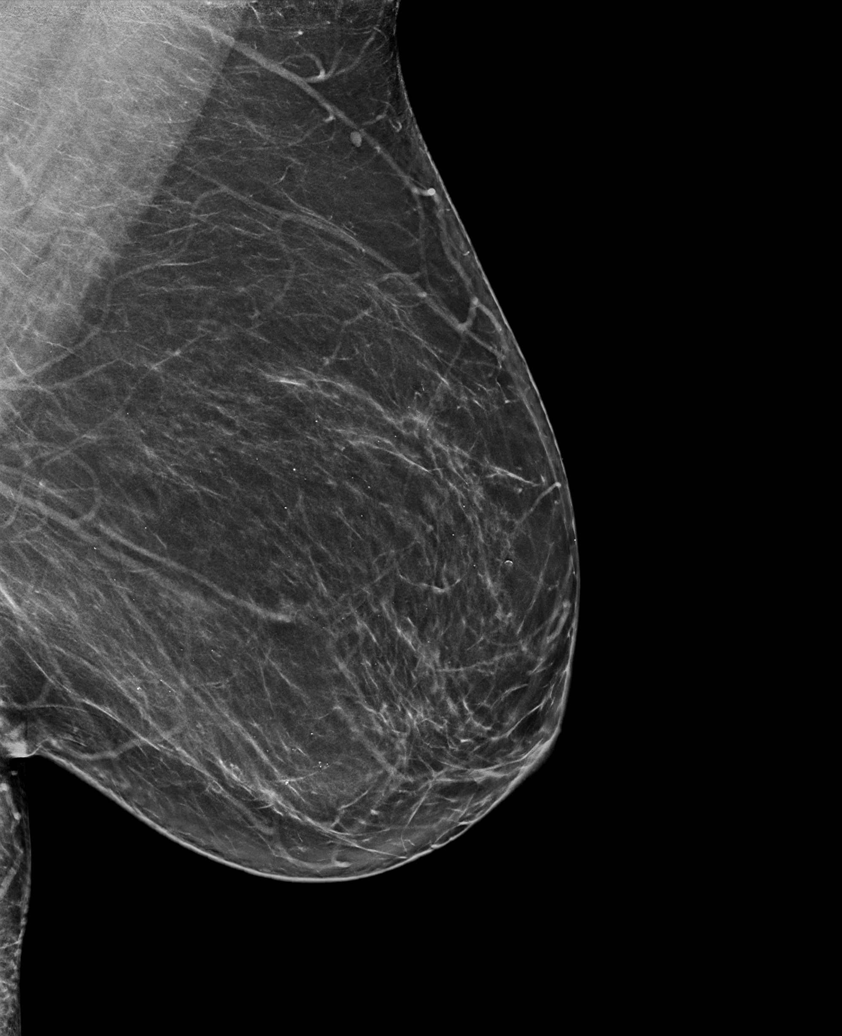

[R MLO tomo · tomo slice 46/91.0]
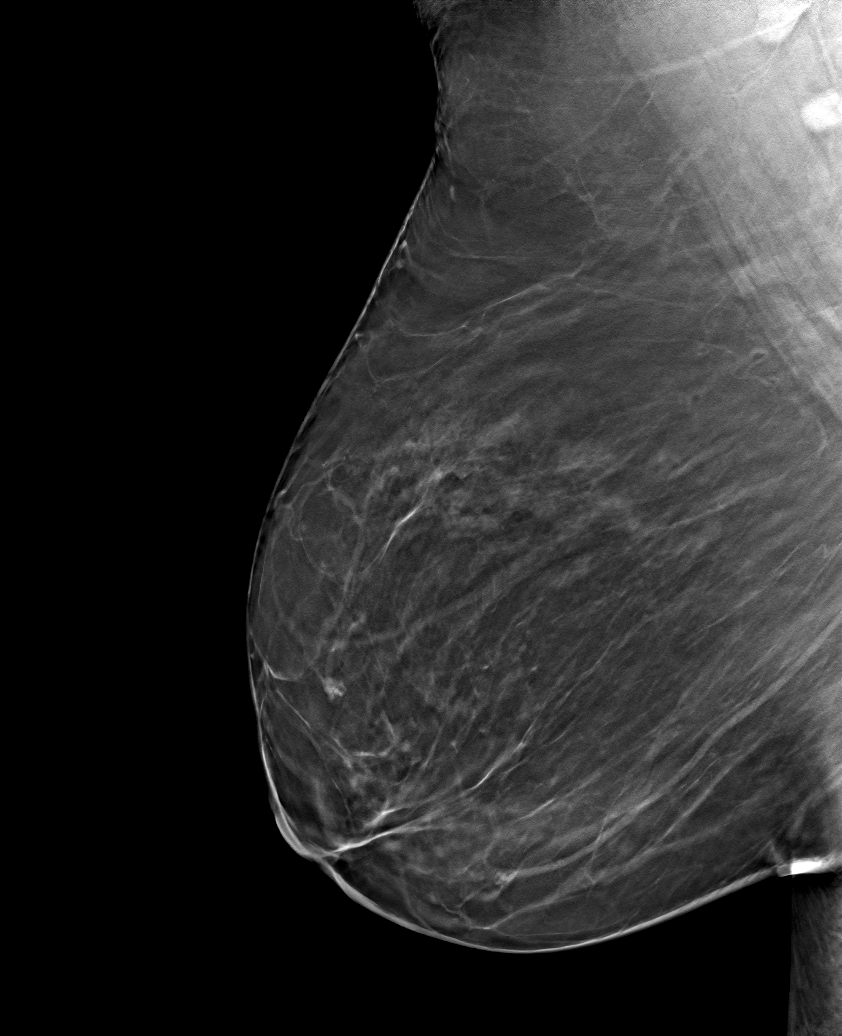

[6 of 30 positions shown; findings below may reference images not displayed]

ACR Breast Density Category b: There are scattered areas of
fibroglandular density.
FINDINGS: There are no findings suspicious for malignancy.
IMPRESSION: No mammographic evidence of malignancy. A result letter of this
screening mammogram will be mailed directly to the patient.

RECOMMENDATION:
Screening mammogram in one year. (Code:51-O-LD2)

BI-RADS CATEGORY  1: Negative.

## 2024-05-17 ENCOUNTER — Ambulatory Visit
Admission: EM | Admit: 2024-05-17 | Discharge: 2024-05-17 | Disposition: A | Discharge status: Home / Self Care | Payer: Medicare (Managed Care) | Attending: Physician Assistant | Admitting: Physician Assistant

## 2024-05-17 DIAGNOSIS — Z23 Encounter for immunization: Secondary | ICD-10-CM

## 2024-05-17 DIAGNOSIS — S61213A Laceration without foreign body of left middle finger without damage to nail, initial encounter: Secondary | ICD-10-CM | POA: Diagnosis not present

## 2024-05-17 MED ORDER — TETANUS-DIPHTH-ACELL PERTUSSIS 5-2-15.5 LF-MCG/0.5 IM SUSP
0.5000 mL | Freq: Once | INTRAMUSCULAR | Status: AC
  Administered 2024-05-17: 0.5 mL via INTRAMUSCULAR

## 2024-05-17 MED ORDER — AMOXICILLIN-POT CLAVULANATE 875-125 MG PO TABS: 1.0000 | ORAL_TABLET | Freq: Two times a day (BID) | ORAL | 0 refills | Status: AC

## 2024-05-17 NOTE — Discharge Instructions (Addendum)
-   Limpie la herida con agua y jabn todos los das. Aplique una pequea cantidad de Neosporin y cbrala con una frula para evitar que se abra y [place].  [place] antitetnica actualizada hoy. - Envi antibiticos a la farmacia para prevenir una infeccin, pero si nota mayor hinchazn, enrojecimiento, secrecin pustulosa, vetas rojas en el dedo o fiebre, acuda a urgencias para una reevaluacin, ya que podra tener una infeccin.  - Clean the wound with soap and water every day.  Apply a small amount of Neosporin and cover with splint so it does not reopen and bleed. -Tetanus updated today - I sent antibiotics to the pharmacy to hopefully prevent infection but if you notice increased swelling, increased redness, pustular drainage, red streaking up finger or fever go to emergency department for reevaluation as you may have an infection.

## 2024-05-17 NOTE — ED Triage Notes (Signed)
 Pt present laceration on left hand/middle finger. The incident took place last night.

## 2024-05-17 NOTE — ED Provider Notes (Signed)
 [facility]    CSN: [id number] Arrival date & time: 05/17/24  1625      History   Chief Complaint Chief Complaint  Patient presents with   Laceration    HPI Rachel Rowe is a 63 y.o. female presenting with daughter for a laceration of the left middle finger that occurred last night when she cut it on a knife while cooking dinner.  Unknown last tetanus immunization.  Patient reports the area continues to bleed when she accidentally knocks it on something.  Denies any severe pain but it is painful.  Full range of motion of the finger.  No numbness or weakness.  Has cleaned the area.  She is right-handed.  No other complaints.  Declines interpreter.  Family members helping to interpret.  HPI  Past Medical History:  Diagnosis Date   Anxiety    Asthma    BMI 40.0-44.9, adult (HCC)    Chronic insomnia    Depression    Diabetes mellitus without complication (HCC)    Diabetic peripheral neuropathy (HCC)    GERD (gastroesophageal reflux disease)    Hypertension    Hypothyroidism    Nephrolithiasis    Sleep apnea     There are no active problems to display for this patient.   Past Surgical History:  Procedure Laterality Date   ABDOMINAL HYSTERECTOMY     COLONOSCOPY WITH PROPOFOL  N/A 04/25/2020   Procedure: COLONOSCOPY WITH PROPOFOL ;  Surgeon: Maryruth Ole DASEN, MD;  Location: [facility];  Service: Endoscopy;  Laterality: N/A;  NEED SPANISH INTERPRETER    OB History   No obstetric history on file.      Home Medications    Prior to Admission medications   Medication Sig Start Date End Date Taking? Authorizing Provider  amoxicillin-clavulanate (AUGMENTIN) 875-125 MG tablet Take 1 tablet by mouth every 12 (twelve) hours for 7 days. 05/17/24 05/24/24 Yes Arvis Huxley B, PA-C  atorvastatin (LIPITOR) 40 MG tablet Take 40 mg by mouth daily.    [provider]  busPIRone (BUSPAR) 5 MG tablet Take 5 mg by mouth 3 (three) times daily.     [provider]  Cholecalciferol 125 MCG (5000 UT) capsule Take 5,000 Units by mouth daily.    [provider]  citalopram (CELEXA) 20 MG tablet Take 20 mg by mouth daily.    [provider]  diclofenac Sodium (VOLTAREN) 1 % GEL Apply topically 4 (four) times daily.    [provider]  famotidine (PEPCID) 20 MG tablet Take 20 mg by mouth 2 (two) times daily.    [provider]  Fluticasone-Salmeterol (ADVAIR) 250-50 MCG/DOSE AEPB Inhale 1 puff into the lungs 2 (two) times daily.    [provider]  folic acid (FOLVITE) 400 MCG tablet Take 400 mcg by mouth daily.    [provider]  gabapentin (NEURONTIN) 600 MG tablet Take 600 mg by mouth 3 (three) times daily.    [provider]  hydrALAZINE (APRESOLINE) 50 MG tablet Take 50 mg by mouth 3 (three) times daily.    [provider]  hydrochlorothiazide (HYDRODIURIL) 25 MG tablet Take 25 mg by mouth daily.    [provider]  ipratropium (ATROVENT HFA) 17 MCG/ACT inhaler Inhale 2 puffs into the lungs every 6 (six) hours.    [provider]  levocetirizine (XYZAL) 5 MG tablet Take 5 mg by mouth every evening.    [provider]  levothyroxine (SYNTHROID) 25 MCG tablet Take 25 mcg by  mouth daily before breakfast.    [provider]  losartan (COZAAR) 100 MG tablet Take 100 mg by mouth daily.    [provider]  Melatonin 5 MG CHEW Chew by mouth.    [provider]  montelukast (SINGULAIR) 10 MG tablet Take 10 mg by mouth at bedtime.    [provider]  nystatin cream (MYCOSTATIN) Apply 1 application topically 2 (two) times daily.    [provider]  pantoprazole (PROTONIX) 40 MG tablet Take 40 mg by mouth daily.    [provider]  sitaGLIPtin (JANUVIA) 100 MG tablet Take 100 mg by mouth daily.    [provider]  spironolactone (ALDACTONE) 25 MG tablet Take 25 mg by mouth daily.     [provider]  tiZANidine (ZANAFLEX) 2 MG tablet Take by mouth every 6 (six) hours as needed for muscle spasms.    [provider]    Family History History reviewed. No pertinent family history.  Social History Social History   Tobacco Use   Smoking status: Never   Smokeless tobacco: Never  Vaping Use   Vaping status: Never Used  Substance Use Topics   Alcohol use: Not Currently   Drug use: Never     Allergies   Trazodone and nefazodone   Review of Systems Review of Systems  Musculoskeletal:  Negative for arthralgias and joint swelling.  Skin:  Positive for wound. Negative for color change.  Neurological:  Negative for weakness and numbness.     Physical Exam Triage Vital Signs ED Triage Vitals  Encounter Vitals Group     BP 05/17/24 1642 (!) 145/88     Girls Systolic BP Percentile --      Girls Diastolic BP Percentile --      Boys Systolic BP Percentile --      Boys Diastolic BP Percentile --      Pulse Rate 05/17/24 1642 73     Resp 05/17/24 1642 18     Temp 05/17/24 1642 98.4 F (36.9 C)     Temp Source 05/17/24 1642 Oral     SpO2 05/17/24 1642 98 %     Weight 05/17/24 1641 233 lb (105.7 kg)     Height --      Head Circumference --      Peak Flow --      Pain Score 05/17/24 1641 8     Pain Loc --      Pain Education --      Exclude from Growth Chart --    No data found.  Updated Vital Signs BP (!) 145/88 (BP Location: Right Arm)   Pulse 73   Temp 98.4 F (36.9 C) (Oral)   Resp 18   Wt 233 lb (105.7 kg)   SpO2 98%   BMI 39.99 kg/m   Physical Exam Vitals and nursing note reviewed.  Constitutional:      General: She is not in acute distress.    Appearance: Normal appearance. She is not ill-appearing or toxic-appearing.  HENT:     Head: Normocephalic and atraumatic.     Nose: Nose normal.  Eyes:     General: No scleral icterus.       Right eye: No discharge.        Left eye: No discharge.     Conjunctiva/sclera:  Conjunctivae normal.  Cardiovascular:     Rate and Rhythm: Normal rate.     Pulses: Normal pulses.  Pulmonary:  Effort: Pulmonary effort is normal. No respiratory distress.  Musculoskeletal:     Cervical back: Neck supple.  Skin:    General: Skin is dry.     Comments: See images included in chart.  Patient has 2 cm laceration of left middle digit.  No active bleeding.  Area has coagulated and started to scab.  Full range of motion of finger.  Neurological:     General: No focal deficit present.     Mental Status: She is alert. Mental status is at baseline.     Motor: No weakness.     Gait: Gait normal.  Psychiatric:        Mood and Affect: Mood normal.        Behavior: Behavior normal.      UC Treatments / Results  Labs (all labs ordered are listed, but only abnormal results are displayed) Labs Reviewed - No data to display  EKG   Radiology No results found.  Procedures Procedures (including critical care time)  Medications Ordered in UC Medications  Tdap (ADACEL) injection 0.5 mL (0.5 mLs Intramuscular Given 05/17/24 1711)    Initial Impression / Assessment and Plan / UC Course  I have reviewed the triage vital signs and the nursing notes.  Pertinent labs & imaging results that were available during my care of the patient were reviewed by me and considered in my medical decision making (see chart for details).   63 year old female presents for laceration of the little finger that occurred 24 hours ago.  Unknown last tetanus immunization so was updated today.  I included an image of her finger in the chart.  Explained that her laceration is over 24 hours old and it is not actively bleeding.  She is diabetic and is concerned for possible infection.  No infection at this time but agreed to provide prophylactic antibiotics.  Sent off and to pharmacy.  Discussed wound care guidelines.  She was given a finger splint to protect the area.  Advised to return for any signs of  infection.  Follow-up as needed.  Final Clinical Impressions(s) / UC Diagnoses   Final diagnoses:  Laceration of left middle finger without foreign body without damage to nail, initial encounter     Discharge Instructions      - Limpie la herida con agua y jabn todos los das. Aplique una pequea cantidad de Neosporin y cbrala con una frula para evitar que se abra y [place].  [place] antitetnica actualizada hoy. - Envi antibiticos a la farmacia para prevenir una infeccin, pero si nota mayor hinchazn, enrojecimiento, secrecin pustulosa, vetas rojas en el dedo o fiebre, acuda a urgencias para una reevaluacin, ya que podra tener una infeccin.  - Clean the wound with soap and water every day.  Apply a small amount of Neosporin and cover with splint so it does not reopen and bleed. -Tetanus updated today - I sent antibiotics to the pharmacy to hopefully prevent infection but if you notice increased swelling, increased redness, pustular drainage, red streaking up finger or fever go to emergency department for reevaluation as you may have an infection.    ED Prescriptions     Medication Sig Dispense Auth. Provider   amoxicillin-clavulanate (AUGMENTIN) 875-125 MG tablet Take 1 tablet by mouth every 12 (twelve) hours for 7 days. 14 tablet Taylin Leder B, PA-C      PDMP not reviewed this encounter.   Arvis Jolan NOVAK, PA-C 05/17/24 1744

## 2024-06-04 ENCOUNTER — Other Ambulatory Visit: Payer: Self-pay | Admitting: Family Medicine

## 2024-06-04 DIAGNOSIS — Z780 Asymptomatic menopausal state: Secondary | ICD-10-CM

## 2024-07-23 ENCOUNTER — Ambulatory Visit
Admission: RE | Admit: 2024-07-23 | Discharge: 2024-07-23 | Disposition: A | Payer: Medicare (Managed Care) | Source: Ambulatory Visit | Attending: Family Medicine | Admitting: Family Medicine

## 2024-07-23 DIAGNOSIS — Z780 Asymptomatic menopausal state: Secondary | ICD-10-CM | POA: Diagnosis present
